# Patient Record
Sex: Male | Born: 1950 | Race: Black or African American | Hispanic: No | Smoking: Current every day smoker
Health system: Southern US, Community
[De-identification: ages and names within clinical notes are randomized; demographics above are authoritative.]

## PROBLEM LIST (undated history)

## (undated) DIAGNOSIS — E785 Hyperlipidemia, unspecified: Secondary | ICD-10-CM

## (undated) DIAGNOSIS — M109 Gout, unspecified: Secondary | ICD-10-CM

## (undated) DIAGNOSIS — I10 Essential (primary) hypertension: Secondary | ICD-10-CM

---

## 2009-10-06 ENCOUNTER — Ambulatory Visit: Payer: Self-pay | Admitting: Gastroenterology

## 2009-10-27 ENCOUNTER — Ambulatory Visit (HOSPITAL_COMMUNITY): Admission: RE | Admit: 2009-10-27 | Discharge: 2009-10-27 | Payer: Self-pay | Admitting: Gastroenterology

## 2009-12-01 ENCOUNTER — Ambulatory Visit: Payer: Self-pay | Admitting: Gastroenterology

## 2010-11-25 ENCOUNTER — Emergency Department (HOSPITAL_BASED_OUTPATIENT_CLINIC_OR_DEPARTMENT_OTHER)
Admission: EM | Admit: 2010-11-25 | Discharge: 2010-11-26 | Disposition: A | Discharge status: Other Acute Inpatient Hospital | Payer: Self-pay | Source: Home / Self Care | Admitting: Emergency Medicine

## 2010-12-04 LAB — POCT CARDIAC MARKERS
CKMB, poc: 1.3 ng/mL (ref 1.0–8.0)
CKMB, poc: <1 ng/mL — ABNORMAL LOW (ref 1.0–8.0)
Myoglobin, poc: 57.6 ng/mL (ref 12–200)
Myoglobin, poc: 34.5 ng/mL (ref 12–200)
Troponin i, poc: <0.05 ng/mL (ref 0.00–0.09)
Troponin i, poc: <0.05 ng/mL (ref 0.00–0.09)

## 2010-12-04 LAB — COMPREHENSIVE METABOLIC PANEL
ALT: 16 U/L (ref 0–53)
AST: 31 U/L (ref 0–37)
Albumin: 4.4 g/dL (ref 3.5–5.2)
Alkaline Phosphatase: 168 U/L — ABNORMAL HIGH (ref 39–117)
BUN: 10 mg/dL (ref 6–23)
CO2: 28 mEq/L (ref 19–32)
Calcium: 9.4 mg/dL (ref 8.4–10.5)
Chloride: 102 mEq/L (ref 96–112)
Creatinine, Ser: 0.9 mg/dL (ref 0.4–1.5)
GFR calc Af Amer: >60 mL/min (ref >60)
GFR calc non Af Amer: >60 mL/min (ref >60)
Glucose, Bld: 88 mg/dL (ref 70–99)
Potassium: 3.3 mEq/L — ABNORMAL LOW (ref 3.5–5.1)
Sodium: 144 mEq/L (ref 135–145)
Total Bilirubin: 1.1 mg/dL (ref 0.3–1.2)
Total Protein: 8.7 g/dL — ABNORMAL HIGH (ref 6.0–8.3)

## 2011-02-20 LAB — CBC
HCT: 35.8 % — ABNORMAL LOW (ref 39.0–52.0)
Hemoglobin: 11.2 g/dL — ABNORMAL LOW (ref 13.0–17.0)
MCHC: 31.1 g/dL (ref 30.0–36.0)
MCV: 78.8 fL (ref 78.0–100.0)
Platelets: 328 10*3/uL (ref 150–400)
RBC: 4.55 MIL/uL (ref 4.22–5.81)
RDW: 16.1 % — ABNORMAL HIGH (ref 11.5–15.5)
WBC: 12.5 10*3/uL — ABNORMAL HIGH (ref 4.0–10.5)

## 2011-02-20 LAB — PROTIME-INR
INR: 0.98 (ref 0.00–1.49)
Prothrombin Time: 12.9 seconds (ref 11.6–15.2)

## 2011-02-20 LAB — APTT: aPTT: 28 seconds (ref 24–37)

## 2013-09-22 ENCOUNTER — Emergency Department (HOSPITAL_BASED_OUTPATIENT_CLINIC_OR_DEPARTMENT_OTHER): Payer: Medicare Other

## 2013-09-22 ENCOUNTER — Encounter (HOSPITAL_BASED_OUTPATIENT_CLINIC_OR_DEPARTMENT_OTHER): Payer: Self-pay | Admitting: Emergency Medicine

## 2013-09-22 ENCOUNTER — Emergency Department (HOSPITAL_BASED_OUTPATIENT_CLINIC_OR_DEPARTMENT_OTHER)
Admission: EM | Admit: 2013-09-22 | Discharge: 2013-09-22 | Disposition: A | Discharge status: Home / Self Care | Payer: Medicare Other | Attending: Emergency Medicine | Admitting: Emergency Medicine

## 2013-09-22 DIAGNOSIS — I10 Essential (primary) hypertension: Secondary | ICD-10-CM | POA: Insufficient documentation

## 2013-09-22 DIAGNOSIS — F17200 Nicotine dependence, unspecified, uncomplicated: Secondary | ICD-10-CM | POA: Insufficient documentation

## 2013-09-22 DIAGNOSIS — Z79899 Other long term (current) drug therapy: Secondary | ICD-10-CM | POA: Insufficient documentation

## 2013-09-22 DIAGNOSIS — R079 Chest pain, unspecified: Secondary | ICD-10-CM

## 2013-09-22 DIAGNOSIS — Z862 Personal history of diseases of the blood and blood-forming organs and certain disorders involving the immune mechanism: Secondary | ICD-10-CM | POA: Insufficient documentation

## 2013-09-22 DIAGNOSIS — Z8639 Personal history of other endocrine, nutritional and metabolic disease: Secondary | ICD-10-CM | POA: Insufficient documentation

## 2013-09-22 DIAGNOSIS — R071 Chest pain on breathing: Secondary | ICD-10-CM | POA: Insufficient documentation

## 2013-09-22 HISTORY — DX: Essential (primary) hypertension: I10

## 2013-09-22 HISTORY — DX: Hyperlipidemia, unspecified: E78.5

## 2013-09-22 HISTORY — DX: Gout, unspecified: M10.9

## 2013-09-22 LAB — CBC WITH DIFFERENTIAL/PLATELET
Basophils Absolute: 0 10*3/uL (ref 0.0–0.1)
Basophils Relative: 0 % (ref 0–1)
Eosinophils Absolute: 0.1 10*3/uL (ref 0.0–0.7)
Eosinophils Relative: 1 % (ref 0–5)
HCT: 43.4 % (ref 39.0–52.0)
Hemoglobin: 14.5 g/dL (ref 13.0–17.0)
Lymphocytes Relative: 40 % (ref 12–46)
Lymphs Abs: 3.9 10*3/uL (ref 0.7–4.0)
MCH: 28.7 pg (ref 26.0–34.0)
MCHC: 33.4 g/dL (ref 30.0–36.0)
MCV: 85.9 fL (ref 78.0–100.0)
Monocytes Absolute: 0.8 10*3/uL (ref 0.1–1.0)
Monocytes Relative: 8 % (ref 3–12)
Neutro Abs: 5.1 10*3/uL (ref 1.7–7.7)
Neutrophils Relative %: 52 % (ref 43–77)
Platelets: 297 10*3/uL (ref 150–400)
RBC: 5.05 MIL/uL (ref 4.22–5.81)
RDW: 12.8 % (ref 11.5–15.5)
WBC: 9.8 10*3/uL (ref 4.0–10.5)

## 2013-09-22 LAB — COMPREHENSIVE METABOLIC PANEL
ALT: 18 U/L (ref 0–53)
AST: 32 U/L (ref 0–37)
Albumin: 3.7 g/dL (ref 3.5–5.2)
Alkaline Phosphatase: 128 U/L — ABNORMAL HIGH (ref 39–117)
BUN: 6 mg/dL (ref 6–23)
CO2: 28 mEq/L (ref 19–32)
Calcium: 9.9 mg/dL (ref 8.4–10.5)
Chloride: 99 mEq/L (ref 96–112)
Creatinine, Ser: 1.3 mg/dL (ref 0.50–1.35)
GFR calc Af Amer: 66 mL/min — ABNORMAL LOW (ref >90)
GFR calc non Af Amer: 57 mL/min — ABNORMAL LOW (ref >90)
Glucose, Bld: 78 mg/dL (ref 70–99)
Potassium: 3.5 mEq/L (ref 3.5–5.1)
Sodium: 139 mEq/L (ref 135–145)
Total Bilirubin: 0.6 mg/dL (ref 0.3–1.2)
Total Protein: 7.6 g/dL (ref 6.0–8.3)

## 2013-09-22 LAB — TROPONIN I: Troponin I: <0.3 ng/mL (ref <0.30)

## 2013-09-22 LAB — D-DIMER, QUANTITATIVE: D-Dimer, Quant: <0.27 ug/mL-FEU (ref 0.00–0.48)

## 2013-09-22 MED ORDER — IBUPROFEN 800 MG PO TABS: 800.0000 mg | ORAL_TABLET | Freq: Three times a day (TID) | ORAL | Status: DC

## 2013-09-22 MED ORDER — MORPHINE SULFATE 4 MG/ML IJ SOLN
4.0000 mg | Freq: Once | INTRAMUSCULAR | Status: AC
  Administered 2013-09-22: 4 mg via INTRAVENOUS
  Filled 2013-09-22: qty 1

## 2013-09-22 MED ORDER — HYDROCODONE-ACETAMINOPHEN 5-325 MG PO TABS: 1.0000 | ORAL_TABLET | ORAL | Status: DC | PRN

## 2013-09-22 MED ORDER — ONDANSETRON HCL 4 MG/2ML IJ SOLN
4.0000 mg | Freq: Once | INTRAMUSCULAR | Status: AC
  Administered 2013-09-22: 4 mg via INTRAVENOUS
  Filled 2013-09-22: qty 2

## 2013-09-22 NOTE — ED Notes (Signed)
Pt reports left sided chest pain while " working out" on sat. Also c/o SOB

## 2013-09-22 NOTE — ED Provider Notes (Signed)
Medical screening examination/treatment/procedure(s) were performed by non-physician practitioner and as supervising physician I was immediately available for consultation/collaboration.  EKG Interpretation     Ventricular Rate:  107 PR Interval:  152 QRS Duration: 84 QT Interval:  334 QTC Calculation: 445 R Axis:   -42 Text Interpretation:  Sinus tachycardia with Premature atrial complexes with Abberant conduction Possible Left atrial enlargement Left axis deviation Abnormal ECG             Geoffery Lyons, MD 09/22/13 2304

## 2013-09-22 NOTE — ED Provider Notes (Signed)
CSN: [id number]     Arrival date & time 09/22/13  1830 History   First MD Initiated Contact with Patient 09/22/13 1846     Chief Complaint  Patient presents with  . Chest Pain   (Consider location/radiation/quality/duration/timing/severity/associated sxs/prior Treatment) HPI Comments: Pt denies any family history of heart disease  Patient is a 62 y.o. male presenting with chest pain. The history is provided by the patient. No language interpreter was used.  Chest Pain Pain location:  L chest Pain quality: aching and sharp   Pain radiates to:  L arm Pain radiates to the back: no   Pain severity:  Moderate Onset quality:  Gradual Timing:  Constant Progression:  Unchanged Chronicity:  New Context: breathing and movement   Relieved by:  Certain positions Worsened by:  Nothing tried Ineffective treatments:  None tried Associated symptoms: no abdominal pain, no fever, no nausea, no shortness of breath, not vomiting and no weakness     Past Medical History  Diagnosis Date  . Hypertension   . Hyperlipidemia   . Gout    History reviewed. No pertinent past surgical history. History reviewed. No pertinent family history. History  Substance Use Topics  . Smoking status: Current Every Day Smoker -- 0.50 packs/day    Types: Cigarettes  . Smokeless tobacco: Not on file  . Alcohol Use: No    Review of Systems  Constitutional: Negative for fever.  Respiratory: Negative for shortness of breath.   Cardiovascular: Positive for chest pain.  Gastrointestinal: Negative for nausea, vomiting and abdominal pain.  Neurological: Negative for weakness.    Allergies  Review of patient's allergies indicates no known allergies.  Home Medications   Current Outpatient Rx  Name  Route  Sig  Dispense  Refill  . lisinopril (PRINIVIL,ZESTRIL) 10 MG tablet   Oral   Take 10 mg by mouth daily.          BP 148/79  Pulse 103  Ht 5\' 11"  (1.803 m)  Wt 225 lb (102.059 kg)  BMI 31.39 kg/m2   SpO2 97% Physical Exam  Nursing note and vitals reviewed. Constitutional: He is oriented to person, place, and time. He appears well-developed and well-nourished.  HENT:  Head: Normocephalic and atraumatic.  Right Ear: External ear normal.  Left Ear: External ear normal.  Eyes: Conjunctivae and EOM are normal.  Neck: Neck supple.  Cardiovascular: Normal rate and regular rhythm.   Pulmonary/Chest: Effort normal and breath sounds normal.  Pt tender along the left chest wall  Abdominal: Soft. Bowel sounds are normal.  Musculoskeletal: Normal range of motion.  Neurological: He is alert and oriented to person, place, and time.  Skin: Skin is warm and dry.  Psychiatric: He has a normal mood and affect.    ED Course  Procedures (including critical care time) Labs Review Labs Reviewed  COMPREHENSIVE METABOLIC PANEL - Abnormal; Notable for the following:    Alkaline Phosphatase 128 (*)    GFR calc non Af Amer 57 (*)    GFR calc Af Amer 66 (*)    All other components within normal limits  CBC WITH DIFFERENTIAL  D-DIMER, QUANTITATIVE  TROPONIN I   Imaging Review Dg Chest 2 View  09/22/2013   CLINICAL DATA:  Short of breath. Smoker  EXAM: CHEST  2 VIEW  COMPARISON:  None.  FINDINGS: Normal mediastinum and cardiac silhouette. Chronic central bronchitic markings. Normal pulmonary vasculature. No effusion, infiltrate, or pneumothorax.  IMPRESSION: No active cardiopulmonary disease.  Mild central bronchitic change.  Electronically Signed   By: Genevive Bi M.D.   On: 09/22/2013 19:41    EKG Interpretation   None       MDM   1. Chest pain    Doubt acs seems more musculoskeletal in nature:Discussed case with Dr. Judd Lien Pt has follow up:pt is no tachycardic or hypoxic:pt has been having pain for 3 days:don't think pt needs a second tropinin:pt states that he is supposed to see his pcp later this week    Teressa Lower, NP 09/22/13 2157  Teressa Lower, NP 09/22/13 2157

## 2014-07-07 ENCOUNTER — Emergency Department (HOSPITAL_BASED_OUTPATIENT_CLINIC_OR_DEPARTMENT_OTHER)
Admission: EM | Admit: 2014-07-07 | Discharge: 2014-07-07 | Disposition: A | Discharge status: Home / Self Care | Payer: Medicare Other | Attending: Emergency Medicine | Admitting: Emergency Medicine

## 2014-07-07 ENCOUNTER — Encounter (HOSPITAL_BASED_OUTPATIENT_CLINIC_OR_DEPARTMENT_OTHER): Payer: Self-pay | Admitting: Emergency Medicine

## 2014-07-07 ENCOUNTER — Emergency Department (HOSPITAL_BASED_OUTPATIENT_CLINIC_OR_DEPARTMENT_OTHER): Payer: Medicare Other

## 2014-07-07 DIAGNOSIS — Z791 Long term (current) use of non-steroidal anti-inflammatories (NSAID): Secondary | ICD-10-CM | POA: Insufficient documentation

## 2014-07-07 DIAGNOSIS — Z79899 Other long term (current) drug therapy: Secondary | ICD-10-CM | POA: Diagnosis not present

## 2014-07-07 DIAGNOSIS — I10 Essential (primary) hypertension: Secondary | ICD-10-CM | POA: Insufficient documentation

## 2014-07-07 DIAGNOSIS — F17200 Nicotine dependence, unspecified, uncomplicated: Secondary | ICD-10-CM | POA: Insufficient documentation

## 2014-07-07 DIAGNOSIS — Z862 Personal history of diseases of the blood and blood-forming organs and certain disorders involving the immune mechanism: Secondary | ICD-10-CM | POA: Insufficient documentation

## 2014-07-07 DIAGNOSIS — R300 Dysuria: Secondary | ICD-10-CM | POA: Insufficient documentation

## 2014-07-07 DIAGNOSIS — R109 Unspecified abdominal pain: Secondary | ICD-10-CM

## 2014-07-07 DIAGNOSIS — Z8639 Personal history of other endocrine, nutritional and metabolic disease: Secondary | ICD-10-CM | POA: Insufficient documentation

## 2014-07-07 LAB — CBC WITH DIFFERENTIAL/PLATELET
BASOS ABS: 0 10*3/uL (ref 0.0–0.1)
Basophils Relative: 0 % (ref 0–1)
Eosinophils Absolute: 0.1 10*3/uL (ref 0.0–0.7)
Eosinophils Relative: 2 % (ref 0–5)
HCT: 40.9 % (ref 39.0–52.0)
Hemoglobin: 13.8 g/dL (ref 13.0–17.0)
Lymphocytes Relative: 53 % — ABNORMAL HIGH (ref 12–46)
Lymphs Abs: 3.6 10*3/uL (ref 0.7–4.0)
MCH: 29 pg (ref 26.0–34.0)
MCHC: 33.7 g/dL (ref 30.0–36.0)
MCV: 85.9 fL (ref 78.0–100.0)
Monocytes Absolute: 0.5 10*3/uL (ref 0.1–1.0)
Monocytes Relative: 7 % (ref 3–12)
NEUTROS ABS: 2.6 10*3/uL (ref 1.7–7.7)
NEUTROS PCT: 38 % — AB (ref 43–77)
Platelets: 302 10*3/uL (ref 150–400)
RBC: 4.76 MIL/uL (ref 4.22–5.81)
RDW: 12.7 % (ref 11.5–15.5)
WBC: 6.8 10*3/uL (ref 4.0–10.5)

## 2014-07-07 LAB — URINALYSIS, ROUTINE W REFLEX MICROSCOPIC
Bilirubin Urine: NEGATIVE
Glucose, UA: NEGATIVE mg/dL
Hgb urine dipstick: NEGATIVE
Ketones, ur: NEGATIVE mg/dL
Leukocytes, UA: NEGATIVE
Nitrite: NEGATIVE
Protein, ur: NEGATIVE mg/dL
Specific Gravity, Urine: 1.015 (ref 1.005–1.030)
Urobilinogen, UA: 1 mg/dL (ref 0.0–1.0)
pH: 7 (ref 5.0–8.0)

## 2014-07-07 LAB — COMPREHENSIVE METABOLIC PANEL
ALBUMIN: 4.1 g/dL (ref 3.5–5.2)
ALT: 17 U/L (ref 0–53)
ANION GAP: 13 (ref 5–15)
AST: 33 U/L (ref 0–37)
Alkaline Phosphatase: 159 U/L — ABNORMAL HIGH (ref 39–117)
BILIRUBIN TOTAL: 0.5 mg/dL (ref 0.3–1.2)
BUN: 9 mg/dL (ref 6–23)
CO2: 25 meq/L (ref 19–32)
CREATININE: 1 mg/dL (ref 0.50–1.35)
Calcium: 9.8 mg/dL (ref 8.4–10.5)
Chloride: 101 mEq/L (ref 96–112)
GFR calc Af Amer: >90 mL/min (ref >90)
GFR, EST NON AFRICAN AMERICAN: 78 mL/min — AB (ref >90)
Glucose, Bld: 89 mg/dL (ref 70–99)
Potassium: 3.9 mEq/L (ref 3.7–5.3)
Sodium: 139 mEq/L (ref 137–147)
Total Protein: 8.3 g/dL (ref 6.0–8.3)

## 2014-07-07 MED ORDER — OXYCODONE-ACETAMINOPHEN 7.5-325 MG PO TABS: 1.0000 | ORAL_TABLET | ORAL | Status: AC | PRN

## 2014-07-07 NOTE — ED Provider Notes (Signed)
CSN: [id number]     Arrival date & time 07/07/14  1328 History   First MD Initiated Contact with Patient 07/07/14 1351     Chief Complaint  Patient presents with  . Flank Pain      HPI Patient presents with left flank pain with some dysuria since Saturday.  Some nausea but no vomiting.  No fever no chills.  No diarrhea.  Denies chest pain.  Has history of hepatitis C. Past Medical History  Diagnosis Date  . Hypertension   . Hyperlipidemia   . Gout    History reviewed. No pertinent past surgical history. No family history on file. History  Substance Use Topics  . Smoking status: Current Every Day Smoker -- 0.50 packs/day    Types: Cigarettes  . Smokeless tobacco: Not on file  . Alcohol Use: No    Review of Systems  All other systems reviewed and are negative  Allergies  Review of patient's allergies indicates no known allergies.  Home Medications   Prior to Admission medications   Medication Sig Start Date End Date Taking? Authorizing Provider  HYDROcodone-acetaminophen (NORCO/VICODIN) 5-325 MG per tablet Take 1-2 tablets by mouth every 4 (four) hours as needed. 09/22/13   Teressa Lower, NP  ibuprofen (ADVIL,MOTRIN) 800 MG tablet Take 1 tablet (800 mg total) by mouth 3 (three) times daily. 09/22/13   Teressa Lower, NP  lisinopril (PRINIVIL,ZESTRIL) 10 MG tablet Take 10 mg by mouth daily.    Historical Provider, MD  oxyCODONE-acetaminophen (PERCOCET) 7.5-325 MG per tablet Take 1 tablet by mouth every 4 (four) hours as needed for pain. 07/07/14   Nelia Shi, MD   BP 153/96  Pulse 104  Temp(Src) 98.2 F (36.8 C) (Oral)  Resp 18  Ht 5' 11.5" (1.816 m)  Wt 223 lb (101.152 kg)  BMI 30.67 kg/m2  SpO2 100% Physical Exam Physical Exam  Nursing note and vitals reviewed. Constitutional: He is oriented to person, place, and time. He appears well-developed and well-nourished. No distress.  HENT:  Head: Normocephalic and atraumatic.  Eyes: Pupils are equal, round,  and reactive to light.  Neck: Normal range of motion.  Cardiovascular: Normal rate and intact distal pulses.   Pulmonary/Chest: No respiratory distress.  Abdominal: Normal appearance. He exhibits no distension.  no CVA tenderness to percussion. Musculoskeletal: Normal range of motion.  Neurological: He is alert and oriented to person, place, and time. No cranial nerve deficit.  Skin: Skin is warm and dry. No rash noted.  Psychiatric: He has a normal mood and affect. His behavior is normal.   ED Course  Procedures (including critical care time) Labs Review Labs Reviewed  COMPREHENSIVE METABOLIC PANEL - Abnormal; Notable for the following:    Alkaline Phosphatase 159 (*)    GFR calc non Af Amer 78 (*)    All other components within normal limits  CBC WITH DIFFERENTIAL - Abnormal; Notable for the following:    Neutrophils Relative % 38 (*)    Lymphocytes Relative 53 (*)    All other components within normal limits  URINALYSIS, ROUTINE W REFLEX MICROSCOPIC    Imaging Review Ct Renal Stone Study  07/07/2014   CLINICAL DATA:  Left flank pain.  EXAM: CT RENAL STONE PROTOCOL  TECHNIQUE: Multidetector CT imaging of the abdomen and pelvis was performed following the standard protocol without intravenous contrast  COMPARISON:  04/14/2013.  FINDINGS: Lung bases show no acute findings. Heart size normal. No pericardial or pleural effusion.  Liver, gallbladder and adrenal glands  are unremarkable. Small stone in the right kidney. Ureters are decompressed. Left kidney, spleen, pancreas, stomach and small bowel are unremarkable. Appendix is normal. Fair amount of stool is seen in the proximal colon.  Prostate and bladder are unremarkable. Atherosclerotic calcification of the arterial vasculature without abdominal aortic aneurysm. No pathologically enlarged lymph nodes. Porta hepatis lymph nodes measure up to 1.3 cm, stable. No worrisome lytic or sclerotic lesions. Degenerative changes are seen in the  spine.  IMPRESSION: 1. No findings to explain the patient's pain. 2. Right renal stone.   Electronically Signed   By: Leanna BattlesMelinda  Blietz M.D.   On: 07/07/2014 14:38     EKG Interpretation None      MDM   Final diagnoses:  Flank pain        Nelia Shiobert L Brenan [place], MD 07/07/14 1512

## 2014-07-07 NOTE — ED Notes (Signed)
Patient transported to CT 

## 2014-07-07 NOTE — ED Notes (Signed)
Left flank pain since Saturday. Dysuria. Reports some nasuea but no vomiting

## 2014-07-07 NOTE — ED Notes (Signed)
MD at bedside. 

## 2014-07-07 NOTE — Discharge Instructions (Signed)
Flank Pain °Flank pain refers to pain that is located on the side of the body between the upper abdomen and the back. The pain may occur over a short period of time (acute) or may be long-term or reoccurring (chronic). It may be mild or severe. Flank pain can be caused by many things. °CAUSES  °Some of the more common causes of flank pain include: °· Muscle strains.   °· Muscle spasms.   °· A disease of your spine (vertebral disk disease).   °· A lung infection (pneumonia).   °· Fluid around your lungs (pulmonary edema).   °· A kidney infection.   °· Kidney stones.   °· A very painful skin rash caused by the chickenpox virus (shingles).   °· Gallbladder disease.   °HOME CARE INSTRUCTIONS  °Home care will depend on the cause of your pain. In general, °· Rest as directed by your caregiver. °· Drink enough fluids to keep your urine clear or pale yellow. °· Only take over-the-counter or prescription medicines as directed by your caregiver. Some medicines may help relieve the pain. °· Tell your caregiver about any changes in your pain. °· Follow up with your caregiver as directed. °SEEK IMMEDIATE MEDICAL CARE IF:  °· Your pain is not controlled with medicine.   °· You have new or worsening symptoms. °· Your pain increases.   °· You have abdominal pain.   °· You have shortness of breath.   °· You have persistent nausea or vomiting.   °· You have swelling in your abdomen.   °· You feel faint or pass out.   °· You have blood in your urine. °· You have a fever or persistent symptoms for more than 2-3 days. °· You have a fever and your symptoms suddenly get worse. °MAKE SURE YOU:  °· Understand these instructions. °· Will watch your condition. °· Will get help right away if you are not doing well or get worse. °Document Released: 12/27/2005 Document Revised: 07/30/2012 Document Reviewed: 06/19/2012 °[employer]® Patient Information ©2015 [employer]. This information is not intended to replace advice given to you by your  health care provider. Make sure you discuss any questions you have with your health care provider. ° °

## 2015-04-07 ENCOUNTER — Emergency Department (HOSPITAL_BASED_OUTPATIENT_CLINIC_OR_DEPARTMENT_OTHER): Payer: Medicare Other

## 2015-04-07 ENCOUNTER — Emergency Department (HOSPITAL_BASED_OUTPATIENT_CLINIC_OR_DEPARTMENT_OTHER)
Admission: EM | Admit: 2015-04-07 | Discharge: 2015-04-07 | Disposition: A | Discharge status: Home / Self Care | Payer: Medicare Other | Attending: Emergency Medicine | Admitting: Emergency Medicine

## 2015-04-07 ENCOUNTER — Encounter (HOSPITAL_BASED_OUTPATIENT_CLINIC_OR_DEPARTMENT_OTHER): Payer: Self-pay | Admitting: *Deleted

## 2015-04-07 DIAGNOSIS — R05 Cough: Secondary | ICD-10-CM | POA: Diagnosis present

## 2015-04-07 DIAGNOSIS — R2240 Localized swelling, mass and lump, unspecified lower limb: Secondary | ICD-10-CM | POA: Diagnosis not present

## 2015-04-07 DIAGNOSIS — R059 Cough, unspecified: Secondary | ICD-10-CM

## 2015-04-07 DIAGNOSIS — R300 Dysuria: Secondary | ICD-10-CM | POA: Insufficient documentation

## 2015-04-07 DIAGNOSIS — R109 Unspecified abdominal pain: Secondary | ICD-10-CM | POA: Insufficient documentation

## 2015-04-07 DIAGNOSIS — I10 Essential (primary) hypertension: Secondary | ICD-10-CM | POA: Diagnosis not present

## 2015-04-07 DIAGNOSIS — Z720 Tobacco use: Secondary | ICD-10-CM | POA: Insufficient documentation

## 2015-04-07 DIAGNOSIS — Z8639 Personal history of other endocrine, nutritional and metabolic disease: Secondary | ICD-10-CM | POA: Insufficient documentation

## 2015-04-07 DIAGNOSIS — R51 Headache: Secondary | ICD-10-CM | POA: Diagnosis not present

## 2015-04-07 DIAGNOSIS — M545 Low back pain, unspecified: Secondary | ICD-10-CM

## 2015-04-07 DIAGNOSIS — Z79899 Other long term (current) drug therapy: Secondary | ICD-10-CM | POA: Insufficient documentation

## 2015-04-07 DIAGNOSIS — R499 Unspecified voice and resonance disorder: Secondary | ICD-10-CM | POA: Diagnosis not present

## 2015-04-07 LAB — CBC WITH DIFFERENTIAL/PLATELET
Basophils Absolute: 0 10*3/uL (ref 0.0–0.1)
Basophils Relative: 0 % (ref 0–1)
EOS PCT: 2 % (ref 0–5)
Eosinophils Absolute: 0.2 10*3/uL (ref 0.0–0.7)
HEMATOCRIT: 39.4 % (ref 39.0–52.0)
Hemoglobin: 13.1 g/dL (ref 13.0–17.0)
LYMPHS PCT: 48 % — AB (ref 12–46)
Lymphs Abs: 3.7 10*3/uL (ref 0.7–4.0)
MCH: 27.2 pg (ref 26.0–34.0)
MCHC: 33.2 g/dL (ref 30.0–36.0)
MCV: 81.9 fL (ref 78.0–100.0)
Monocytes Absolute: 0.6 10*3/uL (ref 0.1–1.0)
Monocytes Relative: 8 % (ref 3–12)
NEUTROS PCT: 42 % — AB (ref 43–77)
Neutro Abs: 3.2 10*3/uL (ref 1.7–7.7)
Platelets: 325 10*3/uL (ref 150–400)
RBC: 4.81 MIL/uL (ref 4.22–5.81)
RDW: 14.5 % (ref 11.5–15.5)
WBC: 7.7 10*3/uL (ref 4.0–10.5)

## 2015-04-07 LAB — COMPREHENSIVE METABOLIC PANEL
ALBUMIN: 4.1 g/dL (ref 3.5–5.0)
ALT: 9 U/L — ABNORMAL LOW (ref 17–63)
ANION GAP: 8 (ref 5–15)
AST: 17 U/L (ref 15–41)
Alkaline Phosphatase: 125 U/L (ref 38–126)
BUN: 11 mg/dL (ref 6–20)
CALCIUM: 9.7 mg/dL (ref 8.9–10.3)
CO2: 26 mmol/L (ref 22–32)
Chloride: 105 mmol/L (ref 101–111)
Creatinine, Ser: 1.25 mg/dL — ABNORMAL HIGH (ref 0.61–1.24)
GFR calc non Af Amer: 60 mL/min — ABNORMAL LOW (ref >60)
GLUCOSE: 104 mg/dL — AB (ref 65–99)
POTASSIUM: 3.5 mmol/L (ref 3.5–5.1)
SODIUM: 139 mmol/L (ref 135–145)
Total Bilirubin: 0.5 mg/dL (ref 0.3–1.2)
Total Protein: 7.8 g/dL (ref 6.5–8.1)

## 2015-04-07 MED ORDER — HYDROCODONE-ACETAMINOPHEN 5-325 MG PO TABS: 1.0000 | ORAL_TABLET | Freq: Four times a day (QID) | ORAL | Status: DC | PRN

## 2015-04-07 MED ORDER — CYCLOBENZAPRINE HCL 10 MG PO TABS: 10.0000 mg | ORAL_TABLET | Freq: Two times a day (BID) | ORAL | Status: DC | PRN

## 2015-04-07 MED ORDER — ALBUTEROL SULFATE HFA 108 (90 BASE) MCG/ACT IN AERS
2.0000 | INHALATION_SPRAY | Freq: Four times a day (QID) | RESPIRATORY_TRACT | Status: DC
  Administered 2015-04-07: 2 via RESPIRATORY_TRACT
  Filled 2015-04-07 (×2): qty 6.7

## 2015-04-07 MED ORDER — KETOROLAC TROMETHAMINE 60 MG/2ML IM SOLN
60.0000 mg | Freq: Once | INTRAMUSCULAR | Status: AC
  Administered 2015-04-07: 60 mg via INTRAMUSCULAR
  Filled 2015-04-07: qty 2

## 2015-04-07 MED ORDER — DM-GUAIFENESIN ER 30-600 MG PO TB12: 1.0000 | ORAL_TABLET | Freq: Two times a day (BID) | ORAL | Status: DC

## 2015-04-07 NOTE — Discharge Instructions (Signed)
Take the Mucinex DM as directed. This is for the cough. Use albuterol inhaler 2 puffs every 6 hours also for the cough. Take pain medication as directed. Take the Flexeril as directed this is for your back. Make an appoint to follow-up with your doctor for further evaluation. Today's workup without any significant findings other than that you do have some degenerative changes in the low part of your back which could explain the back pain. Chest x-ray was negative.

## 2015-04-07 NOTE — ED Notes (Signed)
Cough and back pain. States his voice is hoarse.

## 2015-04-07 NOTE — ED Provider Notes (Signed)
CSN: [id number]     Arrival date & time 04/07/15  2110 History  This chart was scribed for Vanetta Mulders, MD by Evon Slack, ED Scribe. This patient was seen in room MH02/MH02 and the patient's care was started at 9:23 PM.      Chief Complaint  Patient presents with  . Cough   Patient is a 64 y.o. male presenting with cough and back pain. The history is provided by the patient. No language interpreter was used.  Cough Severity:  Moderate Onset quality:  Gradual Relieved by:  None tried Worsened by:  Nothing tried Ineffective treatments:  None tried Associated symptoms: headaches   Associated symptoms: no chest pain, no chills, no fever, no rash, no rhinorrhea, no shortness of breath and no sore throat   Back Pain Location:  Lumbar spine Radiates to:  R posterior upper leg and L posterior upper leg Pain severity:  Moderate Onset quality:  Gradual Timing:  Intermittent Relieved by:  None tried Associated symptoms: abdominal pain, dysuria and headaches   Associated symptoms: no chest pain, no fever, no numbness and no weakness    HPI Comments: Derek Chung is a 64 y.o. male who presents to the Emergency Department complaining of intermittent low back pain that radiates down to his hamstrings that began 3months prior but has recently worsened over the past 3-4 days prior. Pt denies any medications PTA. Pt reports cough with associated voice change as well. Pt reports abdominal pain that's worse when coughing as well. Pt denies injury or fall.  Denies numbness or weakness.   Past Medical History  Diagnosis Date  . Hypertension   . Hyperlipidemia   . Gout    History reviewed. No pertinent past surgical history. No family history on file. History  Substance Use Topics  . Smoking status: Current Every Day Smoker -- 0.50 packs/day    Types: Cigarettes  . Smokeless tobacco: Not on file  . Alcohol Use: No    Review of Systems  Constitutional: Negative for fever and chills.   HENT: Positive for voice change. Negative for rhinorrhea and sore throat.   Eyes: Negative for visual disturbance.  Respiratory: Positive for cough. Negative for shortness of breath.   Cardiovascular: Positive for leg swelling. Negative for chest pain.  Gastrointestinal: Positive for abdominal pain. Negative for nausea, vomiting and diarrhea.  Genitourinary: Positive for dysuria.  Musculoskeletal: Positive for back pain. Negative for neck pain.  Skin: Negative for rash.  Neurological: Positive for headaches. Negative for weakness and numbness.  Hematological: Does not bruise/bleed easily.  Psychiatric/Behavioral: Negative for confusion.     Allergies  Review of patient's allergies indicates no known allergies.  Home Medications   Prior to Admission medications   Medication Sig Start Date End Date Taking? Authorizing Provider  cyclobenzaprine (FLEXERIL) 10 MG tablet Take 1 tablet (10 mg total) by mouth 2 (two) times daily as needed for muscle spasms. 04/07/15   Vanetta Mulders, MD  dextromethorphan-guaiFENesin [facility] DM) 30-600 MG per 12 hr tablet Take 1 tablet by mouth 2 (two) times daily. 04/07/15   Vanetta Mulders, MD  HYDROcodone-acetaminophen (NORCO/VICODIN) 5-325 MG per tablet Take 1-2 tablets by mouth every 4 (four) hours as needed. 09/22/13   Teressa Lower, NP  HYDROcodone-acetaminophen (NORCO/VICODIN) 5-325 MG per tablet Take 1-2 tablets by mouth every 6 (six) hours as needed for moderate pain. 04/07/15   Vanetta Mulders, MD  ibuprofen (ADVIL,MOTRIN) 800 MG tablet Take 1 tablet (800 mg total) by mouth 3 (three) times daily.  09/22/13   Teressa LowerVrinda Pickering, NP  lisinopril (PRINIVIL,ZESTRIL) 10 MG tablet Take 10 mg by mouth daily.    Historical Provider, MD  oxyCODONE-acetaminophen (PERCOCET) 7.5-325 MG per tablet Take 1 tablet by mouth every 4 (four) hours as needed for pain. 07/07/14   Nelva Nayobert Beaton, MD   BP 154/81 mmHg  Pulse 98  Temp(Src) 98.1 F (36.7 C) (Oral)  Resp 18  Ht  5' 11.5" (1.816 m)  Wt 235 lb (106.595 kg)  BMI 32.32 kg/m2  SpO2 99%   Physical Exam  Constitutional: He is oriented to person, place, and time. He appears well-developed and well-nourished. No distress.  HENT:  Head: Normocephalic and atraumatic.  Mouth/Throat: Oropharynx is clear and moist.  Eyes: Conjunctivae and EOM are normal. Pupils are equal, round, and reactive to light. No scleral icterus.  Neck: Neck supple. No tracheal deviation present.  Cardiovascular: Normal rate, regular rhythm and normal heart sounds.   No murmur heard. Pulmonary/Chest: Effort normal and breath sounds normal. No respiratory distress. He has no wheezes.  Abdominal: Bowel sounds are normal. He exhibits distension. There is no tenderness.  Musculoskeletal: Normal range of motion. He exhibits no edema.  Neurological: He is alert and oriented to person, place, and time. No cranial nerve deficit.  Skin: Skin is warm and dry.  Psychiatric: He has a normal mood and affect. His behavior is normal.  Nursing note and vitals reviewed.   ED Course  Procedures (including critical care time) DIAGNOSTIC STUDIES: Oxygen Saturation is 99% on RA, normal by my interpretation.    COORDINATION OF CARE: 9:52 PM-Discussed treatment plan with pt at bedside and pt agreed to plan.     Labs Review Labs Reviewed  COMPREHENSIVE METABOLIC PANEL - Abnormal; Notable for the following:    Glucose, Bld 104 (*)    Creatinine, Ser 1.25 (*)    ALT 9 (*)    GFR calc non Af Amer 60 (*)    All other components within normal limits  CBC WITH DIFFERENTIAL/PLATELET - Abnormal; Notable for the following:    Neutrophils Relative % 42 (*)    Lymphocytes Relative 48 (*)    All other components within normal limits   Results for orders placed or performed during the hospital encounter of 04/07/15  Comprehensive metabolic panel  Result Value Ref Range   Sodium 139 135 - 145 mmol/L   Potassium 3.5 3.5 - 5.1 mmol/L   Chloride 105 101  - 111 mmol/L   CO2 26 22 - 32 mmol/L   Glucose, Bld 104 (H) 65 - 99 mg/dL   BUN 11 6 - 20 mg/dL   Creatinine, Ser 9.521.25 (H) 0.61 - 1.24 mg/dL   Calcium 9.7 8.9 - 84.110.3 mg/dL   Total Protein 7.8 6.5 - 8.1 g/dL   Albumin 4.1 3.5 - 5.0 g/dL   AST 17 15 - 41 U/L   ALT 9 (L) 17 - 63 U/L   Alkaline Phosphatase 125 38 - 126 U/L   Total Bilirubin 0.5 0.3 - 1.2 mg/dL   GFR calc non Af Amer 60 (L) >60 mL/min   GFR calc Af Amer >60 >60 mL/min   Anion gap 8 5 - 15  CBC with Differential/Platelet  Result Value Ref Range   WBC 7.7 4.0 - 10.5 K/uL   RBC 4.81 4.22 - 5.81 MIL/uL   Hemoglobin 13.1 13.0 - 17.0 g/dL   HCT 32.439.4 40.139.0 - 02.752.0 %   MCV 81.9 78.0 - 100.0 fL   MCH 27.2  26.0 - 34.0 pg   MCHC 33.2 30.0 - 36.0 g/dL   RDW 30.814.5 65.711.5 - 84.615.5 %   Platelets 325 150 - 400 K/uL   Neutrophils Relative % 42 (L) 43 - 77 %   Lymphocytes Relative 48 (H) 12 - 46 %   Monocytes Relative 8 3 - 12 %   Eosinophils Relative 2 0 - 5 %   Basophils Relative 0 0 - 1 %   Neutro Abs 3.2 1.7 - 7.7 K/uL   Lymphs Abs 3.7 0.7 - 4.0 K/uL   Monocytes Absolute 0.6 0.1 - 1.0 K/uL   Eosinophils Absolute 0.2 0.0 - 0.7 K/uL   Basophils Absolute 0.0 0.0 - 0.1 K/uL     Imaging Review Dg Chest 2 View  04/07/2015   CLINICAL DATA:  Cough for 3 months.  EXAM: CHEST  2 VIEW  COMPARISON:  09/22/2013  FINDINGS: Lungs are clear. Heart and mediastinum are within normal limits. The trachea is midline. No large pleural effusions. No acute bone abnormality.  IMPRESSION: No active cardiopulmonary disease.   Electronically Signed   By: Richarda OverlieAdam  Henn M.D.   On: 04/07/2015 22:54   Dg Lumbar Spine Complete  04/07/2015   CLINICAL DATA:  Intermittent low back pain which radiates to the hamstrings beginning approximately 3 months ago.  EXAM: LUMBAR SPINE - COMPLETE 4+ VIEW  COMPARISON:  Abdominal CT- 11/09/2014; 07/07/2014  FINDINGS: There are 5 non rib-bearing lumbar type vertebral bodies.  There is mild straightening expected lumbar lordosis. No  anterolisthesis or retrolisthesis. No definite pars defects.  Unchanged mild (under 25%) compression deformity involving the T12 vertebral body. Remaining lumbar vertebral body heights appear preserved.  Moderate multilevel lumbar spine DDD, likely worse at L3-L4 with disc space height loss, endplate irregularity and sclerosis.  Bridging syndesmophytes are noted about the L1-L2, L2-L3 and L3-L4 intervertebral disc spaces.  Limited visualization the bilateral SI joints is normal. Vascular calcifications. Note is made of a punctate (approximately 3 mm) calcification overlying the expected location of the left renal fossa and may correlate with the nonobstructing renal stones seen on prior noncontrast abdominal CT.  IMPRESSION: 1. No acute findings. 2. Moderate multilevel lumbar spine DDD, worse at L3-L4. 3. Suspected right-sided nephrolithiasis, similar to prior abdominal CT.   Electronically Signed   By: Simonne ComeJohn  Watts M.D.   On: 04/07/2015 22:58     EKG Interpretation None      MDM   Final diagnoses:  Cough  Bilateral low back pain without sciatica   Patient with multiple symptoms to include a sort of a chronic cough and hoarseness for weeks to months. Chest x-ray negative. Will treat with albuterol and Mucinex DM. More acute finding was low back pain without any injury pain is bilateral radiates into the bilateral buttocks area but no focal neuro deficits. X-rays of the lumbar spine show evidence of degenerative changes. Patient will be treated symptomatically for this. Patient does have primary care Dr. for follow-up and will recommend close follow-up and further evaluation by his doctor.   I personally performed the services described in this documentation, which was scribed in my presence. The recorded information has been reviewed and is accurate.       Vanetta MuldersScott Lycan Davee, MD 04/07/15 [phone]

## 2015-05-02 ENCOUNTER — Emergency Department (HOSPITAL_BASED_OUTPATIENT_CLINIC_OR_DEPARTMENT_OTHER): Payer: Medicare Other

## 2015-05-02 ENCOUNTER — Encounter (HOSPITAL_BASED_OUTPATIENT_CLINIC_OR_DEPARTMENT_OTHER): Payer: Self-pay

## 2015-05-02 ENCOUNTER — Emergency Department (HOSPITAL_BASED_OUTPATIENT_CLINIC_OR_DEPARTMENT_OTHER)
Admission: EM | Admit: 2015-05-02 | Discharge: 2015-05-02 | Disposition: A | Discharge status: Home / Self Care | Payer: Medicare Other | Attending: Emergency Medicine | Admitting: Emergency Medicine

## 2015-05-02 DIAGNOSIS — Z720 Tobacco use: Secondary | ICD-10-CM | POA: Insufficient documentation

## 2015-05-02 DIAGNOSIS — Z8639 Personal history of other endocrine, nutritional and metabolic disease: Secondary | ICD-10-CM | POA: Diagnosis not present

## 2015-05-02 DIAGNOSIS — R05 Cough: Secondary | ICD-10-CM | POA: Diagnosis not present

## 2015-05-02 DIAGNOSIS — R059 Cough, unspecified: Secondary | ICD-10-CM

## 2015-05-02 DIAGNOSIS — M109 Gout, unspecified: Secondary | ICD-10-CM | POA: Insufficient documentation

## 2015-05-02 DIAGNOSIS — I10 Essential (primary) hypertension: Secondary | ICD-10-CM | POA: Diagnosis not present

## 2015-05-02 DIAGNOSIS — Z79899 Other long term (current) drug therapy: Secondary | ICD-10-CM | POA: Insufficient documentation

## 2015-05-02 DIAGNOSIS — R599 Enlarged lymph nodes, unspecified: Secondary | ICD-10-CM | POA: Diagnosis not present

## 2015-05-02 MED ORDER — PREDNISONE 10 MG PO TABS: 10.0000 mg | ORAL_TABLET | ORAL | Status: AC

## 2015-05-02 MED ORDER — ALBUTEROL SULFATE HFA 108 (90 BASE) MCG/ACT IN AERS: 1.0000 | INHALATION_SPRAY | Freq: Four times a day (QID) | RESPIRATORY_TRACT | Status: DC | PRN

## 2015-05-02 MED ORDER — PREDNISONE 10 MG PO TABS
10.0000 mg | ORAL_TABLET | ORAL | Status: DC
Start: 2015-05-02 — End: 2015-05-02

## 2015-05-02 MED ORDER — IPRATROPIUM BROMIDE 0.02 % IN SOLN: 0.5000 mg | Freq: Once | RESPIRATORY_TRACT | Status: DC

## 2015-05-02 MED ORDER — IPRATROPIUM-ALBUTEROL 0.5-2.5 (3) MG/3ML IN SOLN
3.0000 mL | RESPIRATORY_TRACT | Status: DC
  Administered 2015-05-02: 3 mL via RESPIRATORY_TRACT
  Filled 2015-05-02: qty 3

## 2015-05-02 MED ORDER — GUAIFENESIN 100 MG/5ML PO LIQD: 100.0000 mg | ORAL | Status: AC | PRN

## 2015-05-02 MED ORDER — ALBUTEROL SULFATE HFA 108 (90 BASE) MCG/ACT IN AERS: 1.0000 | INHALATION_SPRAY | Freq: Four times a day (QID) | RESPIRATORY_TRACT | Status: AC | PRN

## 2015-05-02 NOTE — ED Notes (Signed)
Attempted to discharge pt. Unable to find D/c papers. PA still completing discharge summary and will notify when complete.

## 2015-05-02 NOTE — Discharge Instructions (Signed)
Please monitor for new or worsening signs or symptoms, return immediately if any present. Please follow-up with your primary care provider immediately for further evaluation and management. Due to year smoking history your due for a screening CT scan of the lungs. Please inform your primary care provider of this information. Please use medication as directed.

## 2015-05-02 NOTE — ED Provider Notes (Signed)
CSN: [id number]     Arrival date & time 05/02/15  1316 History   First MD Initiated Contact with Patient 05/02/15 1319     Chief Complaint  Patient presents with  . Cough    HPI    64 year old male presents with cough. He reports that the cough has been present for the last 3 months, reports that he's been seen previously for this with 2 rounds of antibiotics that have not improved cough. Patient denies production, persistent throughout the day. He reports that he had an albuterol inhaler but has run out. Cough unchanged from previous ED visit for which she received a chest x-ray that showed no active cardiopulmonary disease.   Patient also reports swelling of his feet, again present since the last visit, bilateral, minimal, no changes since previous visit. Denies history of congestive heart failure, chest pain, SOB,orthopnea.   Patient also reports left shoulder pain, after falling 3 days ago landing on the shoulder. Immediate pain, painful range of motion throughout.       Past Medical History  Diagnosis Date  . Hypertension   . Hyperlipidemia   . Gout    History reviewed. No pertinent past surgical history. No family history on file. History  Substance Use Topics  . Smoking status: Current Every Day Smoker -- 0.50 packs/day    Types: Cigarettes  . Smokeless tobacco: Not on file  . Alcohol Use: No    Review of Systems  All other systems reviewed and are negative.   Allergies  Review of patient's allergies indicates no known allergies.  Home Medications   Prior to Admission medications   Medication Sig Start Date End Date Taking? Authorizing Provider  albuterol (PROVENTIL HFA;VENTOLIN HFA) 108 (90 BASE) MCG/ACT inhaler Inhale 1-2 puffs into the lungs every 6 (six) hours as needed for wheezing or shortness of breath. 05/02/15   Eyvonne Mechanic, PA-C  guaiFENesin (ROBITUSSIN) 100 MG/5ML liquid Take 5-10 mLs (100-200 mg total) by mouth every 4 (four) hours as needed for  cough. 05/02/15   Eyvonne Mechanic, PA-C  lisinopril (PRINIVIL,ZESTRIL) 10 MG tablet Take 10 mg by mouth daily.    Historical Provider, MD  oxyCODONE-acetaminophen (PERCOCET) 7.5-325 MG per tablet Take 1 tablet by mouth every 4 (four) hours as needed for pain. 07/07/14   Nelva Nay, MD  predniSONE (DELTASONE) 10 MG tablet Take 1 tablet (10 mg total) by mouth See admin instructions. Take 6 tabs by mouth daily  for 2 days, then 5 tabs for 2 days, then 4 tabs for 2 days, then 3 tabs for 2 days, 2 tabs for 2 days, then 1 tab by mouth daily for 2 days 05/02/15   Eyvonne Mechanic, PA-C   BP 144/72 mmHg  Pulse 102  Temp(Src) 98.8 F (37.1 C) (Oral)  Resp 18  Ht 5' 11.5" (1.816 m)  Wt 235 lb (106.595 kg)  BMI 32.32 kg/m2  SpO2 100%   Physical Exam  Constitutional: He is oriented to person, place, and time. He appears well-developed and well-nourished.  HENT:  Head: Normocephalic and atraumatic.  Mouth/Throat: Uvula is midline, oropharynx is clear and moist and mucous membranes are normal.  Difficult oral exam due to patients ability to relax tongue, uvula slightly visible midline, unable to fully visualize posterior oropharynx  Eyes: Conjunctivae are normal. Pupils are equal, round, and reactive to light. Right eye exhibits no discharge. Left eye exhibits no discharge. No scleral icterus.  Neck: Trachea normal and normal range of motion. Neck supple. No JVD  present. No tracheal deviation present. No thyroid mass present.  Anterior cervical adenopathy, non-tender to palpation. Hoarse vocie  Pulmonary/Chest: Effort normal and breath sounds normal. No stridor. No respiratory distress. He has no wheezes. He has no rales. He exhibits no tenderness.  Musculoskeletal:  No obvious deformities of left shoulder, full range of motion, tender to palpation of the anterior left shoulder. No signs of trauma  Neurological: He is alert and oriented to person, place, and time. Coordination normal.  Psychiatric: He has  a normal mood and affect. His behavior is normal. Judgment and thought content normal.  Nursing note and vitals reviewed.   ED Course  Procedures (including critical care time) Labs Review Labs Reviewed - No data to display  Imaging Review Dg Chest 2 View  05/02/2015   CLINICAL DATA:  Cough for 2 months, mid chest pain  EXAM: CHEST  2 VIEW  COMPARISON:  04/07/2015  FINDINGS: Cardiomediastinal silhouette is stable. No acute infiltrate or pleural effusion. No pulmonary edema. Bony thorax is unremarkable.  IMPRESSION: No active cardiopulmonary disease.   Electronically Signed   By: Natasha Mead M.D.   On: 05/02/2015 14:19   Dg Shoulder Left  05/02/2015   CLINICAL DATA:  Left shoulder pain for 3 days after a fall, initial encounter.  EXAM: LEFT SHOULDER - 2+ VIEW  COMPARISON:  None.  FINDINGS: No fracture or dislocation. Mild to moderate degenerative changes in the left acromioclavicular joint. Visualized portion of the left chest is unremarkable.  IMPRESSION: 1. No acute findings. 2. 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   Electronically Signed   By: Leanna Battles M.D.   On: 05/02/2015 14:09     EKG Interpretation None      MDM   Final diagnoses:  Cough    Labs:none indicated  Imaging:DG chest 2 view, DG shoulder left  Consults: none  Therapeutics: none  Assessment: Cough,   Plan: Patient presents with a chronic cough, no findings on physical exam, chest x-ray normal, Wells 1.57 likely DVT, pneumonia, or acute process. Patient is in no respiratory distress. Symptoms improved with nebulized treatment. Patient also presents with shoulder pain, DG shoulder left negative, physical exam minimal pain with palpation, full range of motion, nonspecific exam. Patient is a smoker with a significant past medical history, he was aware of the need for screening CT chest, he reports that he has close follow-up with his primary care provider and inform him of this  information. Patient is instructed to use steroids, inhaler, rest, monitor for new or worsening signs or symptoms, return immediately if any present. He is instructed to contact his primary care provider and inform them of his visit today in follow-up within next 2-3 days. Patient verbalizes admits to today's plan and had no further questions concerns at time of discharge.      Eyvonne Mechanic, PA-C 05/03/15 1255  Mirian Mo, MD 05/05/15 [phone]

## 2015-05-02 NOTE — ED Notes (Signed)
PA at bedside. D/C paperwork not ready at present time.

## 2015-05-02 NOTE — ED Notes (Addendum)
C/o cough x 3 months-completed abx x 2 rounds per pt-also c/o pain to left shoulder from a fall Saturday pm-also c/o bilat ankle swelling

## 2015-05-02 NOTE — ED Notes (Signed)
Pt c/o cough and leg swelling following a cold that "lasted 45months."    Pt c/o abdominal pain with coughing and states yellow sputum.  Today pt c/o left shoulder pain post fall "left leg gave out" and tenderness to left upper chest/subclavicular region post fall also.  Pt states fall occurred on Saturday.

## 2015-08-17 ENCOUNTER — Encounter: Payer: Self-pay | Admitting: Family Medicine

## 2015-09-05 ENCOUNTER — Emergency Department (HOSPITAL_BASED_OUTPATIENT_CLINIC_OR_DEPARTMENT_OTHER): Payer: Medicare Other

## 2015-09-05 ENCOUNTER — Emergency Department (HOSPITAL_BASED_OUTPATIENT_CLINIC_OR_DEPARTMENT_OTHER)
Admission: EM | Admit: 2015-09-05 | Discharge: 2015-09-05 | Disposition: A | Discharge status: Home / Self Care | Payer: Medicare Other | Attending: Emergency Medicine | Admitting: Emergency Medicine

## 2015-09-05 ENCOUNTER — Encounter (HOSPITAL_BASED_OUTPATIENT_CLINIC_OR_DEPARTMENT_OTHER): Payer: Self-pay | Admitting: Emergency Medicine

## 2015-09-05 DIAGNOSIS — I10 Essential (primary) hypertension: Secondary | ICD-10-CM | POA: Insufficient documentation

## 2015-09-05 DIAGNOSIS — Z720 Tobacco use: Secondary | ICD-10-CM | POA: Insufficient documentation

## 2015-09-05 DIAGNOSIS — Z79899 Other long term (current) drug therapy: Secondary | ICD-10-CM | POA: Diagnosis not present

## 2015-09-05 DIAGNOSIS — R05 Cough: Secondary | ICD-10-CM | POA: Diagnosis present

## 2015-09-05 DIAGNOSIS — Z8639 Personal history of other endocrine, nutritional and metabolic disease: Secondary | ICD-10-CM | POA: Diagnosis not present

## 2015-09-05 DIAGNOSIS — M109 Gout, unspecified: Secondary | ICD-10-CM | POA: Diagnosis not present

## 2015-09-05 DIAGNOSIS — J383 Other diseases of vocal cords: Secondary | ICD-10-CM | POA: Diagnosis not present

## 2015-09-05 DIAGNOSIS — R0602 Shortness of breath: Secondary | ICD-10-CM

## 2015-09-05 LAB — BASIC METABOLIC PANEL
Anion gap: 6 (ref 5–15)
BUN: 6 mg/dL (ref 6–20)
CO2: 28 mmol/L (ref 22–32)
Calcium: 9.2 mg/dL (ref 8.9–10.3)
Chloride: 103 mmol/L (ref 101–111)
Creatinine, Ser: 1.1 mg/dL (ref 0.61–1.24)
GFR calc Af Amer: >60 mL/min (ref >60)
GFR calc non Af Amer: >60 mL/min (ref >60)
Glucose, Bld: 104 mg/dL — ABNORMAL HIGH (ref 65–99)
Potassium: 3.4 mmol/L — ABNORMAL LOW (ref 3.5–5.1)
Sodium: 137 mmol/L (ref 135–145)

## 2015-09-05 LAB — CBC WITH DIFFERENTIAL/PLATELET
Basophils Absolute: 0 10*3/uL (ref 0.0–0.1)
Basophils Relative: 0 %
Eosinophils Absolute: 0.1 10*3/uL (ref 0.0–0.7)
Eosinophils Relative: 2 %
HCT: 42 % (ref 39.0–52.0)
Hemoglobin: 14.3 g/dL (ref 13.0–17.0)
Lymphocytes Relative: 51 %
Lymphs Abs: 3.4 10*3/uL (ref 0.7–4.0)
MCH: 29.3 pg (ref 26.0–34.0)
MCHC: 34 g/dL (ref 30.0–36.0)
MCV: 86.1 fL (ref 78.0–100.0)
Monocytes Absolute: 0.5 10*3/uL (ref 0.1–1.0)
Monocytes Relative: 7 %
Neutro Abs: 2.6 10*3/uL (ref 1.7–7.7)
Neutrophils Relative %: 40 %
Platelets: 289 10*3/uL (ref 150–400)
RBC: 4.88 MIL/uL (ref 4.22–5.81)
RDW: 12.8 % (ref 11.5–15.5)
WBC: 6.6 10*3/uL (ref 4.0–10.5)

## 2015-09-05 MED ORDER — IOHEXOL 300 MG/ML  SOLN
100.0000 mL | Freq: Once | INTRAMUSCULAR | Status: AC | PRN
  Administered 2015-09-05: 100 mL via INTRAVENOUS

## 2015-09-05 NOTE — Discharge Instructions (Signed)
Follow-up with the ENT, Dr. provided.  Call their office tomorrow morning for an appointment

## 2015-09-05 NOTE — ED Notes (Addendum)
Pt walked back from CT 

## 2015-09-05 NOTE — ED Provider Notes (Signed)
CSN: [id number]     Arrival date & time 09/05/15  1733 History   First MD Initiated Contact with Patient 09/05/15 1917     Chief Complaint  Patient presents with  . Cough     (Consider location/radiation/quality/duration/timing/severity/associated sxs/prior Treatment) HPI Patient presents to the emergency department with hoarseness and cough for the last 3 months.  The patient states she has had a very raspy voice.  He states at times it hard to talk because his toe swollen in his throat.  The patient states that nothing seems make condition better or worse.  The patient states that he does not have any chest pain, shortness of breath, nausea, vomiting, weakness, dizziness, headache, blurred vision, back pain, neck pain, abdominal pain or syncope.  The patient states that he is not taking any medications prior to arrival for her symptoms Past Medical History  Diagnosis Date  . Hypertension   . Hyperlipidemia   . Gout    History reviewed. No pertinent past surgical history. History reviewed. No pertinent family history. Social History  Substance Use Topics  . Smoking status: Current Every Day Smoker -- 0.50 packs/day    Types: Cigarettes  . Smokeless tobacco: None  . Alcohol Use: No    Review of Systems All other systems negative except as documented in the HPI. All pertinent positives and negatives as reviewed in the HPI.   Allergies  Review of patient's allergies indicates no known allergies.  Home Medications   Prior to Admission medications   Medication Sig Start Date End Date Taking? Authorizing Provider  allopurinol (ZYLOPRIM) 100 MG tablet Take 100 mg by mouth daily.   Yes Historical Provider, MD  NIFEdipine (PROCARDIA) 10 MG capsule Take 10 mg by mouth 3 (three) times daily.   Yes Historical Provider, MD  albuterol (PROVENTIL HFA;VENTOLIN HFA) 108 (90 BASE) MCG/ACT inhaler Inhale 1-2 puffs into the lungs every 6 (six) hours as needed for wheezing or shortness of  breath. 05/02/15   Eyvonne MechanicJeffrey Hedges, PA-C  guaiFENesin (ROBITUSSIN) 100 MG/5ML liquid Take 5-10 mLs (100-200 mg total) by mouth every 4 (four) hours as needed for cough. 05/02/15   Eyvonne MechanicJeffrey Hedges, PA-C  lisinopril (PRINIVIL,ZESTRIL) 10 MG tablet Take 10 mg by mouth daily.    Historical Provider, MD  oxyCODONE-acetaminophen (PERCOCET) 7.5-325 MG per tablet Take 1 tablet by mouth every 4 (four) hours as needed for pain. 07/07/14   Nelva Nayobert Beaton, MD  predniSONE (DELTASONE) 10 MG tablet Take 1 tablet (10 mg total) by mouth See admin instructions. Take 6 tabs by mouth daily  for 2 days, then 5 tabs for 2 days, then 4 tabs for 2 days, then 3 tabs for 2 days, 2 tabs for 2 days, then 1 tab by mouth daily for 2 days 05/02/15   Eyvonne MechanicJeffrey Hedges, PA-C   BP 132/71 mmHg  Pulse 91  Temp(Src) 98.3 F (36.8 C) (Oral)  Resp 18  Ht 6' (1.829 m)  Wt 238 lb (107.956 kg)  BMI 32.27 kg/m2  SpO2 92% Physical Exam  Constitutional: He is oriented to person, place, and time. He appears well-developed and well-nourished. No distress.  HENT:  Head: Normocephalic and atraumatic.  Mouth/Throat: Oropharynx is clear and moist.  Patient is noted raspy voice with some difficulty speaking due to this  Eyes: Pupils are equal, round, and reactive to light.  Neck: Normal range of motion. Neck supple.  Cardiovascular: Normal rate, regular rhythm and normal heart sounds.  Exam reveals no gallop and no friction rub.  No murmur heard. Pulmonary/Chest: Effort normal and breath sounds normal. No respiratory distress. He has no wheezes.  Abdominal: Soft. Bowel sounds are normal. He exhibits no distension. There is no tenderness.  Neurological: He is alert and oriented to person, place, and time. He exhibits normal muscle tone. Coordination normal.  Skin: Skin is warm and dry. No rash noted. No erythema.  Psychiatric: He has a normal mood and affect. His behavior is normal.  Nursing note and vitals reviewed.   ED Course  Procedures  (including critical care time) Labs Review Labs Reviewed  BASIC METABOLIC PANEL - Abnormal; Notable for the following:    Potassium 3.4 (*)    Glucose, Bld 104 (*)    All other components within normal limits  CBC WITH DIFFERENTIAL/PLATELET    Imaging Review Dg Chest 2 View  09/05/2015  CLINICAL DATA:  Shortness of breath.  Cough for 3 months. EXAM: CHEST  2 VIEW COMPARISON:  05/02/2015 FINDINGS: There is artifact projecting over the left upper hemithorax on the PA view related to patient's hair. The cardiomediastinal contours are normal. Pulmonary vasculature is normal. No consolidation, pleural effusion, or pneumothorax. No acute osseous abnormalities are seen. Chronic degenerative change about the right shoulder are partially included. IMPRESSION: No acute pulmonary process. Electronically Signed   By: Rubye Oaks M.D.   On: 09/05/2015 18:24   Ct Soft Tissue Neck W Contrast  09/05/2015  CLINICAL DATA:  Raspy breathing and cough for 3 months. Current every day smoker. EXAM: CT NECK WITH CONTRAST TECHNIQUE: Multidetector CT imaging of the neck was performed using the standard protocol following the bolus administration of intravenous contrast. CONTRAST:  OMNIPAQUE IOHEXOL 300 MG/ML  SOLN COMPARISON:  CT chest reported separately. FINDINGS: Pharynx and larynx: Pharyngeal mucosal spaces with appear unremarkable. No tonsillar masses. No or pharyngeal lesions. Soft tissue mass involving the LEFT vocal cord which projects medially into the airway (image 75 series 8). LEFT arytenoid is mildly sclerotic. LEFT anterior thyroid cartilage may be invaded versus incompletely mineralized (image 76 series 8). The anterior commissure is full. Findings concerning for squamous cell carcinoma. Direct visualization is warranted. Salivary glands: Unremarkable Thyroid: Unremarkable Lymph nodes: Shotty nodes.  No pathologically enlarged adenopathy. Vascular: Patent brachiocephalic vasculature. Minor  atheromatous change LEFT carotid bifurcation. Limited intracranial: Negative. Visualized orbits: Negative. Mastoids and visualized paranasal sinuses: No air-fluid level or significant opacity. Skeleton: No worrisome lesions.  Spondylosis. Upper chest: Reported separately. 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 The lesion may involve the anterior commissure. In the setting of a smoker with three-month history of cough and hoarseness, ENT consultation is warranted for direct laryngoscopy and biopsy. Sclerotic LEFT arytenoid cartilage, and asymmetric mineralization versus destruction of the LEFT thyroid cartilage are associated findings of uncertain significance. Electronically Signed   By: Elsie Stain M.D.   On: 09/05/2015 21:34   Ct Chest W Contrast  09/05/2015  CLINICAL DATA:  Hoarseness for 3 months.  Cough. EXAM: CT CHEST WITH CONTRAST TECHNIQUE: Multidetector CT imaging of the chest was performed during intravenous contrast administration. CONTRAST:  OMNIPAQUE IOHEXOL 300 MG/ML  SOLN COMPARISON:  None. FINDINGS: Mediastinum/Nodes: No axillary supraclavicular adenopathy. No mediastinal hilar adenopathy. No pericardial fluid. Esophagus is normal. Lungs/Pleura: Subpleural nodule in the RIGHT upper lobe measures 5 mm image 32, series 5. No airspace disease or consolidation. Central airways are normal. The trachea is normal. Upper abdomen: Small calcification the RIGHT kidney. Limited view of the liver, kidneys, pancreas are unremarkable. Normal adrenal glands.  Musculoskeletal: No aggressive osseous lesion. IMPRESSION: 1. No evidence of pulmonary infection.  Airways appear normal. 2. No mediastinal lymphadenopathy. 3. Small subpleural nodule in the RIGHT upper lobe is likely benign. If the patient is at high risk for bronchogenic carcinoma, follow-up chest CT at 6-12 months is recommended. If the patient is at low risk for bronchogenic carcinoma, follow-up chest CT at 12 months is  recommended. This recommendation follows the consensus statement: Guidelines for Management of Small Pulmonary Nodules Detected on CT Scans: A Statement from the Fleischner Society as published in Radiology 2005;237:395-400. Electronically Signed   By: Genevive Bi M.D.   On: 09/05/2015 21:14   I have personally reviewed and evaluated these images and lab results as part of my medical decision-making.    MDM   Final diagnoses:  SOB (shortness of breath)    Patient is referred to ENT and have advised him of the findings of the CT scan.  Patient agrees to the plan and all questions were answered    Charlestine Night, PA-C 09/09/15 1153  Dione Booze, MD 09/09/15 2311

## 2015-09-05 NOTE — ED Notes (Signed)
Patient states that for the last 3 months he has had a cough and "cold in my throat". Patient states that he is unable to  Cough it up. Reports that he has raspy breathing. Raspy voice noted/

## 2015-09-05 NOTE — ED Notes (Signed)
Patient transported to CT 

## 2016-12-29 IMAGING — CR DG LUMBAR SPINE COMPLETE 4+V
5 series · 5 of 5 positions shown · non-contrast
Comparison: Abdominal CT- 11/09/2014; 07/07/2014

CLINICAL DATA: Intermittent low back pain which radiates to the
hamstrings beginning approximately 3 months ago.

EXAM:
LUMBAR SPINE - COMPLETE 4+ VIEW

[t l-spine a.p.]
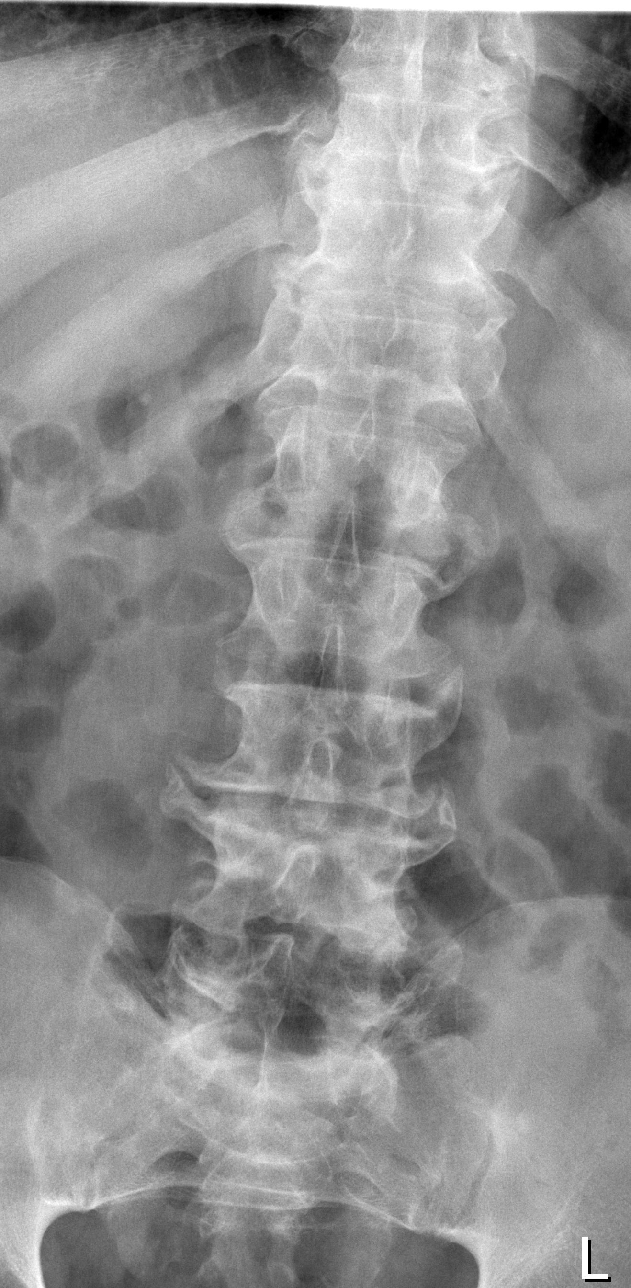

[t l-spine oblique exposure (1 of 2)]
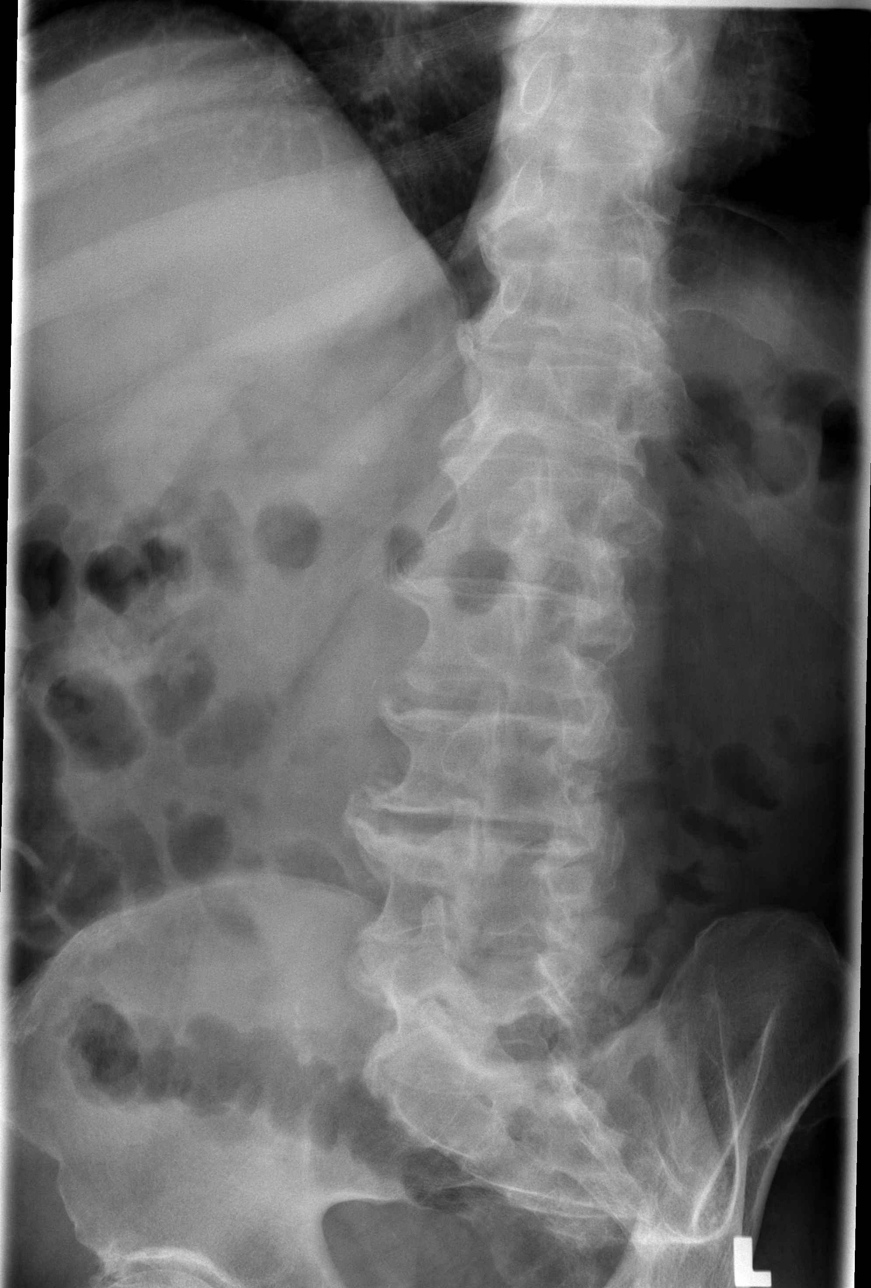

[t l-spine oblique exposure (2 of 2)]
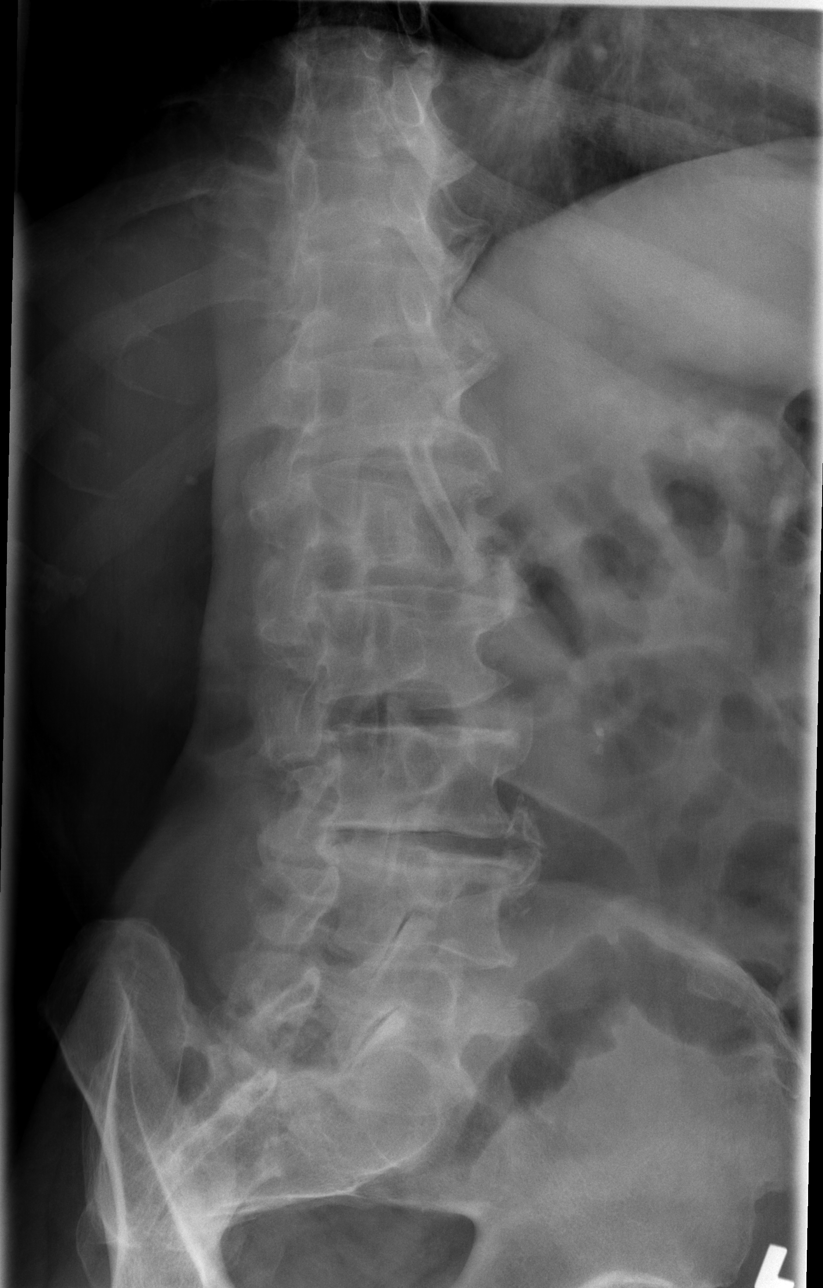

[t l-spine lat]
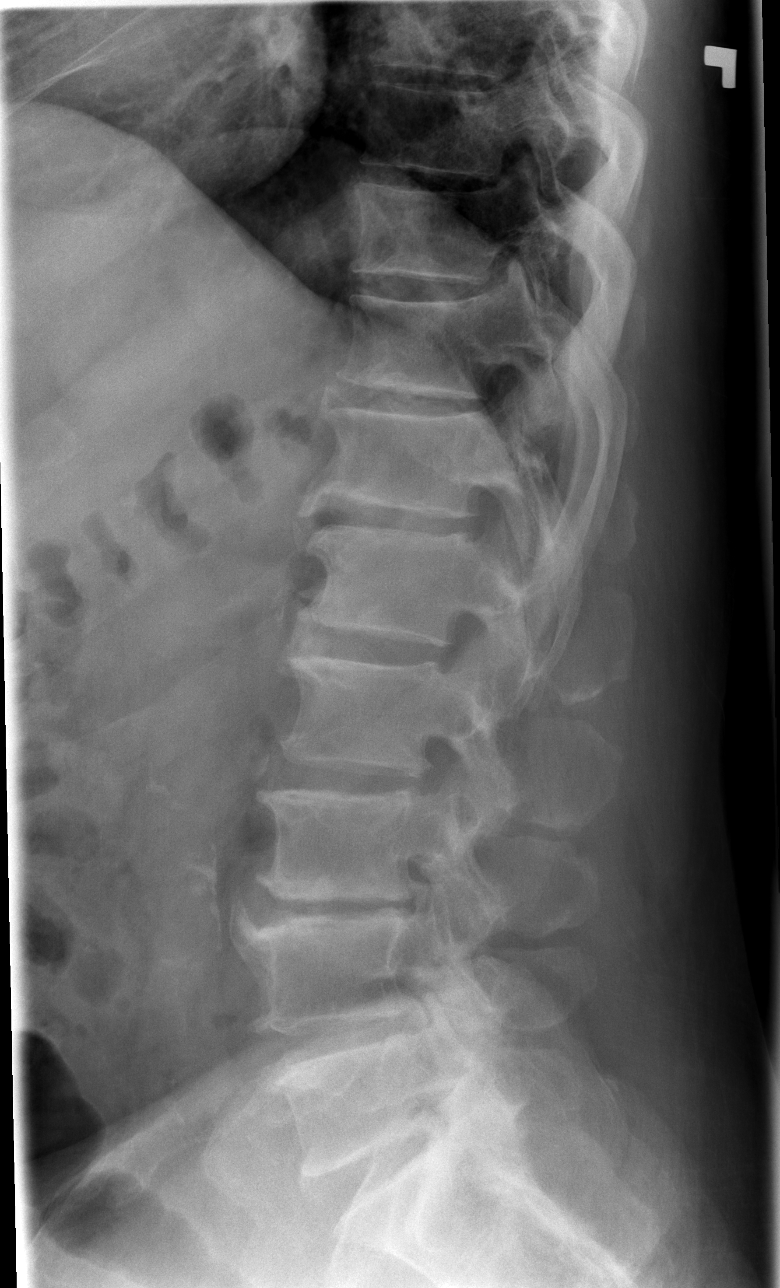

[t l-spine l5-s1 spot]
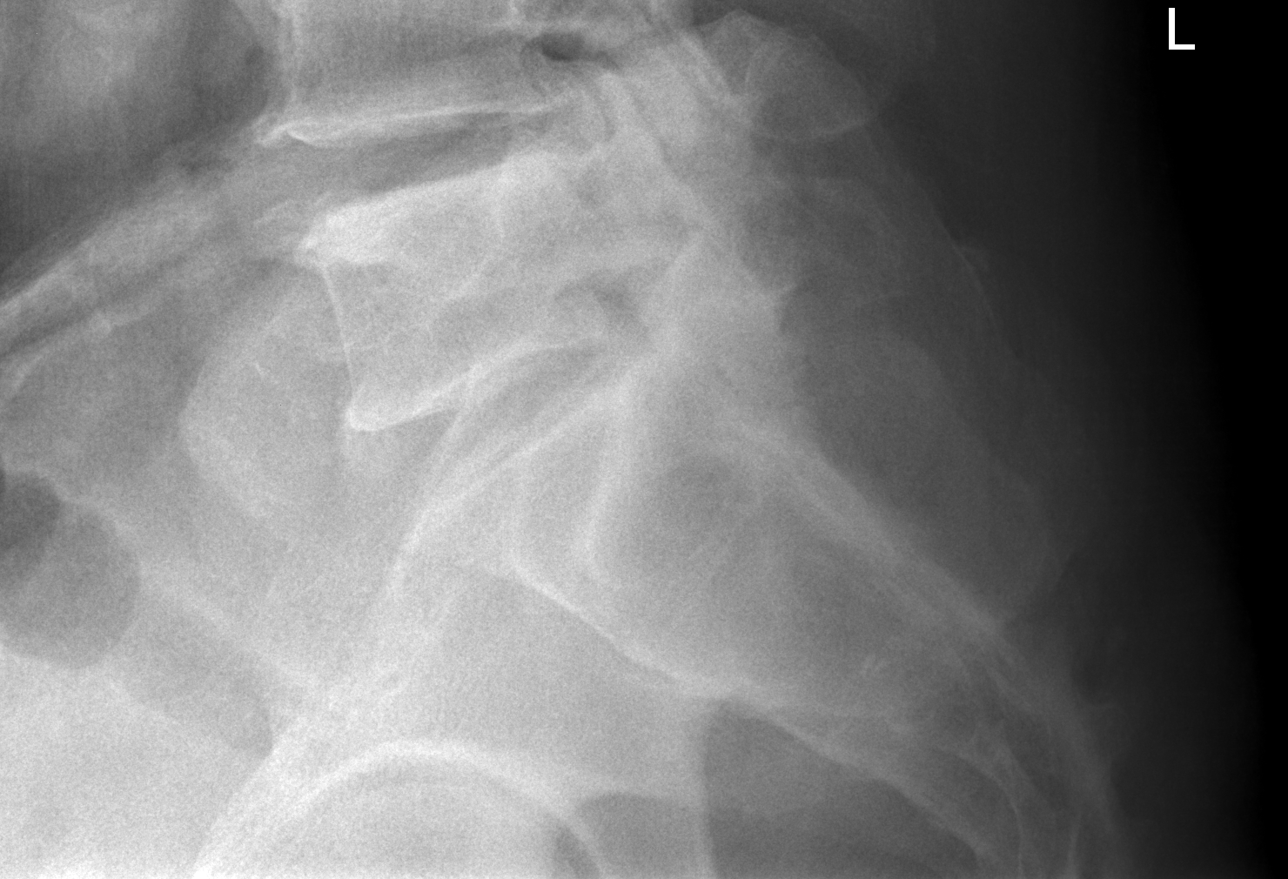

[5 of 5 positions shown; findings below may reference images not displayed]

FINDINGS: There are 5 non rib-bearing lumbar type vertebral bodies.

There is mild straightening expected lumbar lordosis. No
anterolisthesis or retrolisthesis. No definite pars defects.

Unchanged mild (under 25%) compression deformity involving the T12
vertebral body. Remaining lumbar vertebral body heights appear
preserved.

Moderate multilevel lumbar spine DDD, likely worse at L3-L4 with
disc space height loss, endplate irregularity and sclerosis.

Bridging syndesmophytes are noted about the L1-L2, L2-L3 and L3-L4
intervertebral disc spaces.

Limited visualization the bilateral SI joints is normal. Vascular
calcifications. Note is made of a punctate (approximately 3 mm)
calcification overlying the expected location of the left renal
fossa and may correlate with the nonobstructing renal stones seen on
prior noncontrast abdominal CT.
IMPRESSION: 1. No acute findings.
2. Moderate multilevel lumbar spine DDD, worse at L3-L4.
3. Suspected right-sided nephrolithiasis, similar to prior abdominal
CT.

## 2017-02-04 ENCOUNTER — Ambulatory Visit (HOSPITAL_BASED_OUTPATIENT_CLINIC_OR_DEPARTMENT_OTHER)
Admission: RE | Admit: 2017-02-04 | Discharge: 2017-02-04 | Disposition: A | Discharge status: Home / Self Care | Payer: Medicare Other | Source: Ambulatory Visit | Attending: Orthopedic Surgery | Admitting: Orthopedic Surgery

## 2017-02-04 ENCOUNTER — Other Ambulatory Visit (HOSPITAL_BASED_OUTPATIENT_CLINIC_OR_DEPARTMENT_OTHER): Payer: Self-pay | Admitting: Orthopedic Surgery

## 2017-02-04 DIAGNOSIS — M19012 Primary osteoarthritis, left shoulder: Secondary | ICD-10-CM | POA: Insufficient documentation

## 2017-02-04 DIAGNOSIS — M7551 Bursitis of right shoulder: Secondary | ICD-10-CM | POA: Diagnosis present

## 2017-02-04 DIAGNOSIS — M7552 Bursitis of left shoulder: Secondary | ICD-10-CM

## 2017-02-04 DIAGNOSIS — M19011 Primary osteoarthritis, right shoulder: Secondary | ICD-10-CM | POA: Insufficient documentation

## 2018-09-19 ENCOUNTER — Other Ambulatory Visit: Payer: Self-pay | Admitting: Internal Medicine

## 2018-09-19 DIAGNOSIS — R229 Localized swelling, mass and lump, unspecified: Secondary | ICD-10-CM

## 2018-09-23 ENCOUNTER — Ambulatory Visit (INDEPENDENT_AMBULATORY_CARE_PROVIDER_SITE_OTHER): Payer: No Typology Code available for payment source

## 2018-09-23 DIAGNOSIS — R229 Localized swelling, mass and lump, unspecified: Secondary | ICD-10-CM

## 2018-09-23 DIAGNOSIS — R221 Localized swelling, mass and lump, neck: Secondary | ICD-10-CM | POA: Diagnosis not present

## 2018-09-24 ENCOUNTER — Other Ambulatory Visit: Payer: No Typology Code available for payment source

## 2023-02-18 DEATH — deceased
